# Patient Record
Sex: Female | Born: 1935 | Race: White | Hispanic: No
Health system: Southern US, Community
[De-identification: ages and names within clinical notes are randomized; demographics above are authoritative.]

---

## 2002-09-05 ENCOUNTER — Encounter: Payer: Self-pay | Admitting: Internal Medicine

## 2004-07-02 ENCOUNTER — Ambulatory Visit: Payer: Self-pay | Admitting: Internal Medicine

## 2004-07-14 ENCOUNTER — Ambulatory Visit: Payer: Self-pay | Admitting: Internal Medicine

## 2004-08-05 ENCOUNTER — Ambulatory Visit: Payer: Self-pay | Admitting: Internal Medicine

## 2004-08-21 ENCOUNTER — Ambulatory Visit: Payer: Self-pay | Admitting: Internal Medicine

## 2004-10-30 ENCOUNTER — Ambulatory Visit: Payer: Self-pay | Admitting: Internal Medicine

## 2005-03-04 ENCOUNTER — Ambulatory Visit: Payer: Self-pay | Admitting: Internal Medicine

## 2005-03-13 ENCOUNTER — Encounter: Payer: Self-pay | Admitting: Internal Medicine

## 2005-07-06 ENCOUNTER — Ambulatory Visit: Payer: Self-pay | Admitting: Internal Medicine

## 2005-11-04 ENCOUNTER — Ambulatory Visit: Payer: Self-pay | Admitting: Internal Medicine

## 2006-03-04 ENCOUNTER — Ambulatory Visit: Payer: Self-pay | Admitting: Internal Medicine

## 2006-03-26 ENCOUNTER — Ambulatory Visit: Payer: Self-pay | Admitting: *Deleted

## 2006-07-22 ENCOUNTER — Ambulatory Visit: Payer: Self-pay | Admitting: Internal Medicine

## 2006-07-22 LAB — CONVERTED CEMR LAB
Albumin: 3.5 g/dL (ref 3.5–5.2)
BUN: 40 mg/dL — ABNORMAL HIGH (ref 6–23)
CO2: 28 meq/L (ref 19–32)
Magnesium: 2.2 mg/dL (ref 1.5–2.5)
Phosphorus: 3.1 mg/dL (ref 2.3–4.6)
Potassium: 4.9 meq/L (ref 3.5–5.1)

## 2006-11-04 DIAGNOSIS — N259 Disorder resulting from impaired renal tubular function, unspecified: Secondary | ICD-10-CM | POA: Insufficient documentation

## 2006-11-04 DIAGNOSIS — M810 Age-related osteoporosis without current pathological fracture: Secondary | ICD-10-CM | POA: Insufficient documentation

## 2006-11-04 DIAGNOSIS — R002 Palpitations: Secondary | ICD-10-CM | POA: Insufficient documentation

## 2006-11-04 DIAGNOSIS — I10 Essential (primary) hypertension: Secondary | ICD-10-CM | POA: Insufficient documentation

## 2006-11-04 DIAGNOSIS — M109 Gout, unspecified: Secondary | ICD-10-CM

## 2006-11-04 DIAGNOSIS — M1990 Unspecified osteoarthritis, unspecified site: Secondary | ICD-10-CM | POA: Insufficient documentation

## 2006-11-24 ENCOUNTER — Ambulatory Visit: Payer: Self-pay | Admitting: Internal Medicine

## 2006-11-24 DIAGNOSIS — L723 Sebaceous cyst: Secondary | ICD-10-CM | POA: Insufficient documentation

## 2007-01-11 ENCOUNTER — Telehealth (INDEPENDENT_AMBULATORY_CARE_PROVIDER_SITE_OTHER): Payer: Self-pay | Admitting: *Deleted

## 2007-03-23 ENCOUNTER — Ambulatory Visit: Payer: Self-pay | Admitting: Internal Medicine

## 2007-03-24 LAB — CONVERTED CEMR LAB
Albumin: 3.7 g/dL (ref 3.5–5.2)
BUN: 50 mg/dL — ABNORMAL HIGH (ref 6–23)
Basophils Absolute: 0 10*3/uL (ref 0.0–0.1)
Basophils Relative: 0.6 % (ref 0.0–1.0)
Creatinine, Ser: 1.8 mg/dL — ABNORMAL HIGH (ref 0.4–1.2)
Eosinophils Absolute: 0.2 10*3/uL (ref 0.0–0.6)
GFR calc Af Amer: 34 mL/min
GFR calc non Af Amer: 28 mL/min
Lymphocytes Relative: 22.8 % (ref 12.0–46.0)
MCHC: 34.4 g/dL (ref 30.0–36.0)
Monocytes Relative: 7.8 % (ref 3.0–11.0)
Neutro Abs: 5.6 10*3/uL (ref 1.4–7.7)
Phosphorus: 3.8 mg/dL (ref 2.3–4.6)
Platelets: 178 10*3/uL (ref 150–400)
Potassium: 5 meq/L (ref 3.5–5.1)
TSH: 2.45 microintl units/mL (ref 0.35–5.50)

## 2007-08-04 ENCOUNTER — Ambulatory Visit: Payer: Self-pay | Admitting: Internal Medicine

## 2007-08-05 LAB — CONVERTED CEMR LAB
CO2: 30 meq/L (ref 19–32)
GFR calc Af Amer: 34 mL/min
Glucose, Bld: 95 mg/dL (ref 70–99)
Phosphorus: 3.6 mg/dL (ref 2.3–4.6)
Potassium: 5.1 meq/L (ref 3.5–5.1)
Sodium: 140 meq/L (ref 135–145)

## 2007-08-08 ENCOUNTER — Emergency Department: Payer: Self-pay | Admitting: Emergency Medicine

## 2007-08-09 ENCOUNTER — Telehealth (INDEPENDENT_AMBULATORY_CARE_PROVIDER_SITE_OTHER): Payer: Self-pay | Admitting: *Deleted

## 2007-08-10 ENCOUNTER — Ambulatory Visit: Payer: Self-pay | Admitting: *Deleted

## 2007-12-08 ENCOUNTER — Ambulatory Visit: Payer: Self-pay | Admitting: Internal Medicine

## 2007-12-12 LAB — CONVERTED CEMR LAB
Albumin: 3.6 g/dL (ref 3.5–5.2)
Basophils Absolute: 0 10*3/uL (ref 0.0–0.1)
CO2: 26 meq/L (ref 19–32)
Chloride: 108 meq/L (ref 96–112)
Creatinine, Ser: 1.9 mg/dL — ABNORMAL HIGH (ref 0.4–1.2)
Eosinophils Absolute: 0.1 10*3/uL (ref 0.0–0.7)
GFR calc Af Amer: 32 mL/min
GFR calc non Af Amer: 26 mL/min
Lymphocytes Relative: 25.1 % (ref 12.0–46.0)
MCHC: 34.6 g/dL (ref 30.0–36.0)
MCV: 86.8 fL (ref 78.0–100.0)
Neutrophils Relative %: 67.9 % (ref 43.0–77.0)
Phosphorus: 3.8 mg/dL (ref 2.3–4.6)
RDW: 13.9 % (ref 11.5–14.6)
Sodium: 142 meq/L (ref 135–145)

## 2007-12-19 ENCOUNTER — Telehealth (INDEPENDENT_AMBULATORY_CARE_PROVIDER_SITE_OTHER): Payer: Self-pay | Admitting: *Deleted

## 2008-03-01 ENCOUNTER — Ambulatory Visit: Payer: Self-pay | Admitting: Internal Medicine

## 2008-04-10 ENCOUNTER — Ambulatory Visit: Payer: Self-pay | Admitting: Internal Medicine

## 2008-08-15 ENCOUNTER — Ambulatory Visit: Payer: Self-pay | Admitting: Internal Medicine

## 2008-08-16 LAB — CONVERTED CEMR LAB
ALT: 12 units/L (ref 0–35)
AST: 15 units/L (ref 0–37)
Alkaline Phosphatase: 67 units/L (ref 39–117)
BUN: 39 mg/dL — ABNORMAL HIGH (ref 6–23)
Basophils Absolute: 0 10*3/uL (ref 0.0–0.1)
Bilirubin, Direct: 0 mg/dL (ref 0.0–0.3)
Calcium: 10.4 mg/dL (ref 8.4–10.5)
Eosinophils Relative: 1.9 % (ref 0.0–5.0)
GFR calc non Af Amer: 27.9 mL/min (ref >60)
Glucose, Bld: 95 mg/dL (ref 70–99)
HCT: 34.6 % — ABNORMAL LOW (ref 36.0–46.0)
Lymphocytes Relative: 28.4 % (ref 12.0–46.0)
Monocytes Relative: 8.8 % (ref 3.0–12.0)
Neutrophils Relative %: 60.7 % (ref 43.0–77.0)
Platelets: 170 10*3/uL (ref 150.0–400.0)
Potassium: 4.5 meq/L (ref 3.5–5.1)
RDW: 13.3 % (ref 11.5–14.6)
Sodium: 144 meq/L (ref 135–145)
TSH: 2.61 microintl units/mL (ref 0.35–5.50)
Total Bilirubin: 0.9 mg/dL (ref 0.3–1.2)
WBC: 8.3 10*3/uL (ref 4.5–10.5)

## 2008-12-01 ENCOUNTER — Telehealth: Payer: Self-pay | Admitting: Internal Medicine

## 2008-12-19 ENCOUNTER — Ambulatory Visit: Payer: Self-pay | Admitting: Internal Medicine

## 2008-12-20 LAB — CONVERTED CEMR LAB
Albumin: 3.6 g/dL (ref 3.5–5.2)
BUN: 45 mg/dL — ABNORMAL HIGH (ref 6–23)
CO2: 28 meq/L (ref 19–32)
Calcium: 10.2 mg/dL (ref 8.4–10.5)
Chloride: 106 meq/L (ref 96–112)
Creatinine, Ser: 2 mg/dL — ABNORMAL HIGH (ref 0.4–1.2)

## 2009-02-01 ENCOUNTER — Telehealth: Payer: Self-pay | Admitting: Internal Medicine

## 2009-02-04 ENCOUNTER — Ambulatory Visit: Payer: Self-pay | Admitting: Internal Medicine

## 2009-02-04 DIAGNOSIS — R5383 Other fatigue: Secondary | ICD-10-CM

## 2009-02-04 DIAGNOSIS — R5381 Other malaise: Secondary | ICD-10-CM

## 2009-02-06 LAB — CONVERTED CEMR LAB
Albumin: 3.5 g/dL (ref 3.5–5.2)
Basophils Absolute: 0.1 10*3/uL (ref 0.0–0.1)
Basophils Relative: 1 % (ref 0.0–3.0)
Bilirubin, Direct: 0 mg/dL (ref 0.0–0.3)
CO2: 27 meq/L (ref 19–32)
Calcium: 10.4 mg/dL (ref 8.4–10.5)
Creatinine, Ser: 2 mg/dL — ABNORMAL HIGH (ref 0.4–1.2)
Eosinophils Absolute: 0.4 10*3/uL (ref 0.0–0.7)
MCHC: 34.3 g/dL (ref 30.0–36.0)
MCV: 86.1 fL (ref 78.0–100.0)
Monocytes Absolute: 0.6 10*3/uL (ref 0.1–1.0)
Neutro Abs: 4.2 10*3/uL (ref 1.4–7.7)
Neutrophils Relative %: 59 % (ref 43.0–77.0)
RBC: 3.78 M/uL — ABNORMAL LOW (ref 3.87–5.11)
RDW: 14 % (ref 11.5–14.6)
Sodium: 141 meq/L (ref 135–145)
TSH: 3.31 microintl units/mL (ref 0.35–5.50)
Total Protein: 6.2 g/dL (ref 6.0–8.3)

## 2009-03-11 ENCOUNTER — Ambulatory Visit: Payer: Self-pay | Admitting: Internal Medicine

## 2009-05-13 ENCOUNTER — Telehealth: Payer: Self-pay | Admitting: Internal Medicine

## 2009-05-14 ENCOUNTER — Telehealth: Payer: Self-pay | Admitting: Internal Medicine

## 2009-06-19 ENCOUNTER — Ambulatory Visit: Payer: Self-pay | Admitting: Internal Medicine

## 2009-06-20 LAB — CONVERTED CEMR LAB
ALT: 11 units/L (ref 0–35)
AST: 15 units/L (ref 0–37)
Alkaline Phosphatase: 69 units/L (ref 39–117)
Basophils Absolute: 0 10*3/uL (ref 0.0–0.1)
Basophils Relative: 0.3 % (ref 0.0–3.0)
CO2: 27 meq/L (ref 19–32)
Chloride: 107 meq/L (ref 96–112)
Eosinophils Relative: 1.6 % (ref 0.0–5.0)
GFR calc non Af Amer: 26.17 mL/min (ref >60)
Hemoglobin: 11.9 g/dL — ABNORMAL LOW (ref 12.0–15.0)
Lymphocytes Relative: 29.2 % (ref 12.0–46.0)
Monocytes Relative: 8.9 % (ref 3.0–12.0)
Neutro Abs: 4.9 10*3/uL (ref 1.4–7.7)
Phosphorus: 3.3 mg/dL (ref 2.3–4.6)
Potassium: 4.5 meq/L (ref 3.5–5.1)
RBC: 3.95 M/uL (ref 3.87–5.11)
TSH: 2.79 microintl units/mL (ref 0.35–5.50)
Total Protein: 6.9 g/dL (ref 6.0–8.3)
WBC: 8.1 10*3/uL (ref 4.5–10.5)

## 2009-07-10 ENCOUNTER — Ambulatory Visit: Payer: Self-pay | Admitting: Family Medicine

## 2009-09-09 ENCOUNTER — Telehealth: Payer: Self-pay | Admitting: Internal Medicine

## 2009-09-10 ENCOUNTER — Ambulatory Visit: Payer: Self-pay | Admitting: Internal Medicine

## 2009-09-10 DIAGNOSIS — F329 Major depressive disorder, single episode, unspecified: Secondary | ICD-10-CM

## 2009-09-12 ENCOUNTER — Telehealth: Payer: Self-pay | Admitting: Internal Medicine

## 2009-10-21 ENCOUNTER — Ambulatory Visit: Payer: Self-pay | Admitting: Internal Medicine

## 2010-01-27 ENCOUNTER — Telehealth (INDEPENDENT_AMBULATORY_CARE_PROVIDER_SITE_OTHER): Payer: Self-pay | Admitting: *Deleted

## 2010-01-30 ENCOUNTER — Ambulatory Visit: Payer: Self-pay | Admitting: Internal Medicine

## 2010-01-30 DIAGNOSIS — F068 Other specified mental disorders due to known physiological condition: Secondary | ICD-10-CM

## 2010-04-28 ENCOUNTER — Emergency Department: Payer: Self-pay | Admitting: Unknown Physician Specialty

## 2010-05-02 ENCOUNTER — Telehealth: Payer: Self-pay | Admitting: Internal Medicine

## 2010-05-02 ENCOUNTER — Inpatient Hospital Stay: Payer: Self-pay | Admitting: Internal Medicine

## 2010-05-05 ENCOUNTER — Encounter: Payer: Self-pay | Admitting: Internal Medicine

## 2010-05-26 ENCOUNTER — Ambulatory Visit: Admit: 2010-05-26 | Payer: Self-pay | Admitting: Internal Medicine

## 2010-06-17 NOTE — Assessment & Plan Note (Signed)
Summary: FOLLOW UP / LFW   Vital Signs:  Patient profile:   77 year old female Weight:      130 pounds BMI:     22.40 Temp:     98.6 degrees F oral BP sitting:   158 / 80  (left arm) Cuff size:   regular  Vitals Entered By: Mervin Hack CMA Duncan Dull) (October 21, 2009 2:41 PM) CC: 4 month follow-up   History of Present Illness: Cousin called patient refused the citalopram   Again she just states that "I am a crabby old lady" still grieving for daughter after 2 years fears her response would be similar to her daughter--who didn't do well with meds offered counselling--she didn't want this now  ongoing aches and pains knee and back  pain ---degenerative disease aspirin helps some  More forgetful this is upsetting to her repeating herself at times  Allergies: 1)  ! Aspirin 2)  ! Hydrochlorothiazide  Past History:  Past medical, surgical, family and social histories (including risk factors) reviewed for relevance to current acute and chronic problems.  Past Medical History: Reviewed history from 09/10/2009 and no changes required. Gout Hypertension Osteoarthritis--knees/back Osteoporosis Renal insufficiency Palpitations Depression  Past Surgical History: Reviewed history from 03/23/2007 and no changes required. L Hip fracture ORIF Hyacinth Meeker ) 2000/10/18 Hysterectomy/ L ovary out 1956 Perforated colon (colostomy x 5 months )            colostomy reversal  1984 Echo mild LVH/MR  Family History: Reviewed history from 11/04/2006 and no changes required. Mom and Dad both died of CAD 3 brothers--2 with CAD 2 sisters--1 died as nurse in WWII ??uterine cancer in [place]    Social History: Reviewed history from 11/04/2006 and no changes required. Widowed 10-19-82 1 daughter with Down's--died @62 --very difficult for her Occupation: home caregiver Never Smoked Alcohol use-no  Review of Systems       bowels have been regular eating fair weight up 2# sleeping  okay   Impression & Recommendations:  Problem # 1:  DEPRESSION (ICD-311) Assessment Unchanged discussed with her early memory problems--may be related to mood problems She has voiced a willingness to try the medicine now some degree of lonlieness discussed considering assisted living  counselled all of 15 minute visit  Her updated medication list for this problem includes:    Citalopram Hydrobromide 10 Mg Tabs (Citalopram hydrobromide) .Marland Kitchen... 1 tab daily for depression  Complete Medication List: 1)  Citalopram Hydrobromide 10 Mg Tabs (Citalopram hydrobromide) .Marland Kitchen.. 1 tab daily for depression 2)  Toprol Xl 50 Mg Tb24 (Metoprolol succinate) .... 1/2 tab daily 3)  Lasix 20 Mg Tabs (Furosemide) .... Take 1 tablet by mouth once a day 4)  Diovan 80 Mg Tabs (Valsartan) .... Take 1 tablet by mouth once a day 5)  Timoptic 0.5 % Soln (Timolol maleate) .... Use one drop in left eye daily 6)  Aleve 220 Mg Tabs (Naproxen sodium) .... As needed 7)  Lutein 6 Mg Caps (Lutein) .... Take one by mouth two times a day 8)  Centrum Silver Tabs (Multiple vitamins-minerals) .... Take one by mouth daily 9)  Vitamin D 1000 Unit Caps (Cholecalciferol) .... Take one tablet by mouth daily 10)  Flax Seeds Powd (Flaxseed (linseed)) .... Take 1 tbs daily 11)  Lidoderm 5 % Ptch (Lidocaine) .... Apply to left knee for 12 hours a day for arthritis pain  Patient Instructions: 1)  Please schedule a follow-up appointment in 1 month.   Current  Allergies (reviewed today): ! ASPIRIN ! HYDROCHLOROTHIAZIDE

## 2010-06-17 NOTE — Progress Notes (Signed)
Summary: wont take celexa  Phone Note Call from Patient   Caller: cousin Eather Colas [phone] Summary of Call: Pt's cousin states pt is now refusing to take celexa, didnt take it this morning.  She says she is not depressed and doesnt need it.  4 week follow up appt cancelled, she will be back in june for 4 month follow up. Initial call taken by: Lowella Petties CMA,  September 12, 2009 12:28 PM  Follow-up for Phone Call        have cousin call me if she thinks things are worsening and we will push up the visit Follow-up by: Cindee Salt MD,  September 12, 2009 1:05 PM  Additional Follow-up for Phone Call Additional follow up Details #1::        Advised pt. Additional Follow-up by: Lowella Petties CMA,  September 12, 2009 2:02 PM

## 2010-06-17 NOTE — Progress Notes (Signed)
Summary: regarding dementia  Phone Note Call from Patient Call back at [phone]   Caller: Nigel Bridgeman Summary of Call: Pt's cousin, who says she has POA, is asking if you can give pt the diagnosis of dementia so that she can go into memory care at [facility].  Cousin says pt is getting forgetfull, not taking  her meds.  I told her that pt will need to be seen for evaluation and documentation.  Can you work her in sometime this week? Initial call taken by: Lowella Petties CMA,  January 27, 2010 9:12 AM  Follow-up for Phone Call        that diagnosis is probably correct Okay to give her appt I will contact Memory Care staff as well Follow-up by: Cindee Salt MD,  January 27, 2010 9:58 AM  Additional Follow-up for Phone Call Additional follow up Details #1::        I spoke w/ pt's cousin,Delores.  She will bring pt in for appt. on 01/30/10 @ 1:45. Additional Follow-up by: Beau Fanny,  January 27, 2010 10:16 AM

## 2010-06-17 NOTE — Assessment & Plan Note (Signed)
Summary: RIGHT KNEE PAIN/CLE   Vital Signs:  Patient profile:   77 year old female Height:      64 inches Weight:      125.50 pounds Temp:     98.3 degrees F oral Pulse rate:   64 / minute Pulse rhythm:   regular BP sitting:   130 / 82  (left arm) Cuff size:   regular  Vitals Entered By: Linde Gillis CMA Duncan Dull) (July 10, 2009 3:10 PM) CC: right knee pain   History of Present Illness: 77 year old female:  Left knee has been bad for years. Had a Synvisc injection series - Dr. Hyacinth Meeker which did minimal, no help.  Now the right knee is hurting a lot. Over the last week.  R knee: No significant swelling. No injury. No trauma. No old injuries that she can recall. Walks with a walker.   Allergies: 1)  ! Aspirin 2)  ! Hydrochlorothiazide  Past History:  Past medical, surgical, family and social histories (including risk factors) reviewed, and no changes noted (except as noted below).  Past Medical History: Reviewed history from 11/04/2006 and no changes required. Gout Hypertension Osteoarthritis--knees/back Osteoporosis Renal insufficiency Palpitations  Past Surgical History: Reviewed history from 03/23/2007 and no changes required. L Hip fracture ORIF Hyacinth Meeker ) October 17, 2000 Hysterectomy/ L ovary out 1956 Perforated colon (colostomy x 5 months )            colostomy reversal  1984 Echo mild LVH/MR  Family History: Reviewed history from 11/04/2006 and no changes required. Mom and Dad both died of CAD 3 brothers--2 with CAD 2 sisters--1 died as nurse in WWII ??uterine cancer in [place]    Social History: Reviewed history from 11/04/2006 and no changes required. Widowed October 18, 1982 1 daughter with Down's--died @62 --very difficult for her Occupation: home caregiver Never Smoked Alcohol use-no  Review of Systems       REVIEW OF SYSTEMS  GEN: No systemic complaints, no fevers, chills, sweats, or other acute illnesses MSK: Detailed in the HPI GI: tolerating PO intake  without difficulty Neuro: No numbness, parasthesias, or tingling associated. Otherwise the pertinent positives of the ROS are noted above.    Physical Exam  General:  Elderlywell-nourished and well-hydrated.   Head:  Normocephalic and atraumatic without obvious abnormalities. No apparent alopecia or balding. Ears:  no external deformities.   Nose:  no external deformity.   Lungs:  normal respiratory effort.   Msk:  L knee, lack 2-3 degrees ext, flexion to 100. TTP medial joint line. Diffuse crepitus. Stable to varus and valgus stress. Neg lachman  R knee, full ext, flexion to 115, terminal pain on ext. moderate crepitus with mild joint line pain. Stable MCL, LCL. neg lachman.   Impression & Recommendations:  Problem # 1:  OSTEOARTHRITIS (ICD-715.90) Assessment Deteriorated >25 minutes spent in face to face time with patient, >50% spent in counselling or coordination of care: spent most time talking to patient and her cousin about OA, function, hyaluronic acid, cartilage transplantation, glucosamine, all potential modalities. I think anything to increase her comfort is reasonable.  Please see the patient instructions for a detailed list of plans and what was discussed with the patient.   X-rays: AP Bilateral Weight-bearing, Weightbearing Lateral, Sunrise views Indication: knee pain Findings:  L knee with end stage arthritis, bone on bone. R knee with moderate tricompartmental OA changes.  Knee Injection, R  Patient verbally consented to procedure. Risks, benefits, and alternatives explained. Sterilely prepped with betadine. Ethyl cholride  used for anesthesia. 9 cc 0.5% Marcaine mixed with 1 cc of Kenalog 40 mg injected using the anterolateral approach without difficulty. No complications with procedure and tolerated well. Patient had decreased pain post-injection.   Her updated medication list for this problem includes:    Aleve 220 Mg Tabs (Naproxen sodium) .Marland Kitchen... As  needed  Orders: Radiology other (Radiology Other) Joint Aspirate / Injection, Large (20610) Kenalog 10mg  (4units) (J3301)  Complete Medication List: 1)  Toprol Xl 50 Mg Tb24 (Metoprolol succinate) .... 1/2 tab daily 2)  Lasix 20 Mg Tabs (Furosemide) .... Take 1 tablet by mouth once a day 3)  Diovan 80 Mg Tabs (Valsartan) .... Take 1 tablet by mouth once a day 4)  Timoptic 0.5 % Soln (Timolol maleate) .... Use one drop in left eye daily 5)  Aleve 220 Mg Tabs (Naproxen sodium) .... As needed 6)  Lutein 6 Mg Caps (Lutein) .... Take one by mouth two times a day 7)  Centrum Silver Tabs (Multiple vitamins-minerals) .... Take one by mouth daily 8)  Vitamin D 1000 Unit Caps (Cholecalciferol) .... Take one tablet by mouth daily 9)  Flax Seeds Powd (Flaxseed (linseed)) .... Take 1 tbs daily 10)  Lidoderm 5 % Ptch (Lidocaine) .... Apply to left knee for 12 hours a day for arthritis pain  Patient Instructions: 1)  Tylenol: 2 tablets up to 3-4 times a day 2)  Topical Capzaicin Cream, as needed (wear glove to put on) 3)  For flares, corticosteroid injections help. 4)  Hyaluronic Acid injections have good success, average relief is 6 months 5)  Omega-3 fish oils may help 6)  Ice joints on bad days, 20 min, 2-3 x / day  Current Allergies (reviewed today): ! ASPIRIN ! HYDROCHLOROTHIAZIDE

## 2010-06-17 NOTE — Assessment & Plan Note (Signed)
Summary: behavior problems/alc   Vital Signs:  Patient profile:   77 year old female Weight:      128 pounds Temp:     98.7 degrees F oral Pulse rate:   68 / minute Pulse rhythm:   regular BP sitting:   128 / 62  (left arm) Cuff size:   regular  Vitals Entered By: Mervin Hack CMA Duncan Dull) (September 10, 2009 12:38 PM) CC: behavior problems   History of Present Illness: Cousin is concerned see phone note  "I'm just a plain old crabby lady" sleeping a lot Crying, having depressed mood Goes back for 2-3 months No precipitating factors Tough time of year---misses husband and daughter  No social involvement Can't drive now with vision Can't walk much Has been isolated since caring for daughter with Down's syndrome who died 2 years ago  does have anhedonia Thinks about dying but has never considered suicide    Allergies: 1)  ! Aspirin 2)  ! Hydrochlorothiazide  Past History:  Past medical, surgical, family and social histories (including risk factors) reviewed for relevance to current acute and chronic problems.  Past Medical History: Gout Hypertension Osteoarthritis--knees/back Osteoporosis Renal insufficiency Palpitations Depression  Past Surgical History: Reviewed history from 03/23/2007 and no changes required. L Hip fracture ORIF Hyacinth Meeker ) 2000/10/08 Hysterectomy/ L ovary out 1956 Perforated colon (colostomy x 5 months )            colostomy reversal  1984 Echo mild LVH/MR  Family History: Reviewed history from 11/04/2006 and no changes required. Mom and Dad both died of CAD 3 brothers--2 with CAD 2 sisters--1 died as nurse in [place] ??uterine cancer in [place]    Social History: Reviewed history from 11/04/2006 and no changes required. 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  NAD Neck:  supple, no masses, no thyromegaly, and no cervical lymphadenopathy.   Lungs:  normal respiratory effort and normal breath sounds.   Heart:  normal rate, regular rhythm, no murmur, and no gallop.   Abdomen:  soft and non-tender.   Extremities:  no edema Neurologic:  alert & oriented X3.   No focal weakness Psych:  normally interactive, good eye contact, not anxious appearing, and dysphoric affect.     Impression & Recommendations:  Problem # 1:  DEPRESSION (ICD-311) Assessment New  clearly fits criteria for major depression reluctant to take meds but willing to try will start low dose citalopram   call if any side effects discussed with her and cousin  Her updated medication list for this problem includes:    Citalopram Hydrobromide 10 Mg Tabs (Citalopram hydrobromide) .Marland Kitchen... 1 tab daily for depression  Orders: Prescription Created Electronically [phone])  Complete Medication List: 1)  Toprol Xl 50 Mg Tb24 (Metoprolol succinate) .... 1/2 tab daily 2)  Lasix 20 Mg Tabs (Furosemide) .... Take 1 tablet by mouth once a day 3)  Diovan 80 Mg Tabs (Valsartan) .... Take 1 tablet by mouth once a day 4)  Timoptic 0.5 % Soln (Timolol maleate) .... Use one drop in left eye daily 5)  Aleve 220 Mg Tabs (Naproxen sodium) .... As needed 6)  Lutein 6 Mg Caps (Lutein) .... Take one by mouth two times a day 7)  Centrum Silver Tabs (Multiple vitamins-minerals) .... Take one by mouth daily 8)  Vitamin D 1000 Unit Caps (Cholecalciferol) .... Take one tablet by mouth daily 9)  Flax Seeds Powd (Flaxseed (linseed)) .... Take 1 tbs daily 10)  Lidoderm 5 % Ptch (Lidocaine) .... Apply to left knee for 12 hours a day for arthritis pain 11)  Citalopram Hydrobromide 10 Mg Tabs (Citalopram hydrobromide) .Marland Kitchen.. 1 tab daily for depression  Patient Instructions: 1)  Please schedule a follow-up  appointment in 3-4  weeks.  Prescriptions: CITALOPRAM HYDROBROMIDE 10 MG TABS (CITALOPRAM HYDROBROMIDE) 1 tab daily for depression  #30 x 11   Entered and Authorized by:   Cindee Salt MD   Signed by:   Cindee Salt MD on 09/10/2009   Method used:   Electronically to        [facility]* (retail)       [address]       [place], [place]  [address]       Ph: [phone]       Fax: [phone]   RxID:   [id number]   Current Allergies (reviewed today): ! ASPIRIN ! HYDROCHLOROTHIAZIDE

## 2010-06-17 NOTE — Progress Notes (Signed)
Summary: Behavior Issues  Phone Note Call from Patient Call back at [phone]   Caller: Delores [place]/Cousin-Power of Attorney Call For: Cindee Salt MD Summary of Call: Has been sleeping and crying for the last several weeks.  Family wants her to move into Assisted Living but she refuses to.  Wants Dr. Alphonsus Sias to prescribe some type of tranqulizer to help her relax or calm down.  Seems to be depressed and doesn't want to be bothered.  States that "she doesn't know why she is still her" every other sentence her cousin says.  She just sits around all day like she is zoned out so to speak.  Uses [facility]. Initial call taken by: Linde Gillis CMA Duncan Dull),  September 09, 2009 11:21 AM  Follow-up for Phone Call        this is a major change for her Please set up appt ASAP for eval and to make decisions about Rx tomorrow or Wednesday at the latest Follow-up by: Cindee Salt MD,  September 09, 2009 1:07 PM  Additional Follow-up for Phone Call Additional follow up Details #1::        POA is checking with patient and she will call back and schedule appt. DeShannon Smith CMA Duncan Dull)  September 09, 2009 2:49 PM   appt scheduled. Additional Follow-up by: Mervin Hack CMA Duncan Dull),  September 09, 2009 3:00 PM

## 2010-06-17 NOTE — Assessment & Plan Note (Signed)
Summary: 6 mo. f/u/bir   Vital Signs:  Patient profile:   77 year old female Weight:      125 pounds Temp:     97.2 degrees F oral Pulse rate:   68 / minute Pulse rhythm:   regular BP sitting:   110 / 60  (left arm) Cuff size:   regular  Vitals Entered By: Mervin Hack CMA Duncan Dull) (February  2, 10/18/2009 11:40 AM) CC: 6 month follow-up   History of Present Illness: Doing fairly well No further spells Now not going grocery shopping  Main concern is ongoing left knee pain Takes aleve but only if she goes out afraid of hydrocodone Tried rollator walker but felt "that the wheels got away from me"  Looking into assisted living at [facility] having a hard time cooking  No chest pain No SOB  Mild edema--lasix controls  Allergies: 1)  ! Aspirin 2)  ! Hydrochlorothiazide  Past History:  Past medical, surgical, family and social histories (including risk factors) reviewed for relevance to current acute and chronic problems.  Past Medical History: Reviewed history from 11/04/2006 and no changes required. Gout Hypertension Osteoarthritis--knees/back Osteoporosis Renal insufficiency Palpitations  Past Surgical History: Reviewed history from 03/23/2007 and no changes required. L Hip fracture ORIF Hyacinth Meeker ) 2000-10-18 Hysterectomy/ L ovary out 1956 Perforated colon (colostomy x 5 months )            colostomy reversal  1984 Echo mild LVH/MR  Family History: Reviewed history from 11/04/2006 and no changes required. Mom and Dad both died of CAD 3 brothers--2 with CAD 2 sisters--1 died as nurse in [place] ??uterine cancer in [place]    Social History: Reviewed history from 11/04/2006 and no changes required. 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   Neck:  supple, no masses, no thyromegaly, no carotid bruits, and no cervical lymphadenopathy.   Lungs:  normal respiratory effort and normal breath sounds.   Heart:  normal rate, regular rhythm, no murmur, and no gallop.   Extremities:  no sig edema Psych:  normally interactive, good eye contact, not anxious appearing, and not depressed appearing.     Impression & Recommendations:  Problem # 1:  WEAKNESS (ICD-780.79) Assessment Improved  better but still functional problems perfect candidate for asssisted living---is looking into this  Orders: TLB-Renal Function Panel (80069-RENAL) TLB-CBC Platelet - w/Differential (85025-CBCD) TLB-Hepatic/Liver Function Pnl (80076-HEPATIC) TLB-TSH (Thyroid Stimulating Hormone) (84443-TSH) Venipuncture (69629)  Problem # 2:  OSTEOARTHRITIS (ICD-715.90) Assessment: Deteriorated more left knee pain will try lidoderm  Her updated medication list for this problem includes:    Aleve 220 Mg Tabs (Naproxen sodium) .Marland Kitchen... As needed  Problem # 3:  HYPERTENSION (ICD-401.9) Assessment: Unchanged BP fine no changes now  Her updated medication list for this problem includes:    Toprol Xl 50 Mg Tb24 (Metoprolol succinate) .Marland Kitchen... 1/2 tab daily    Lasix 20 Mg Tabs (Furosemide) .Marland Kitchen... Take 1 tablet by mouth once a day    Diovan 80 Mg Tabs (Valsartan) .Marland Kitchen... Take 1 tablet by mouth once a day  BP today: 110/60 Prior BP: 158/80 (03/11/2009)  Labs Reviewed: K+: 4.8 (02/04/2009) Creat: : 2.0 (02/04/2009)  Problem # 4:  RENAL INSUFFICIENCY (ICD-588.9) Assessment: Comment Only no symptoms  Complete Medication List: 1)  Toprol Xl 50 Mg Tb24 (Metoprolol succinate) .... 1/2 tab daily 2)  Lasix 20 Mg Tabs (Furosemide) .... Take 1 tablet by mouth once a day 3)  Diovan 80 Mg Tabs (Valsartan) .... Take 1 tablet by mouth once a day 4)  Timoptic 0.5 % Soln (Timolol  maleate) .... Use one drop in left eye daily 5)  Aleve 220 Mg Tabs (Naproxen sodium) .... As needed 6)  Lutein 6 Mg Caps (Lutein) .... Take one by mouth two times a day 7)  Centrum Silver Tabs (Multiple vitamins-minerals) .... Take one by mouth daily 8)  Vitamin D (ergocalciferol) 50000 Unit Caps (Ergocalciferol) .Marland Kitchen.. 1 capsule by mouth monthly 9)  Flax Seeds Powd (Flaxseed (linseed)) .... Take 1 tbs daily 10)  Lidoderm 5 % Ptch (Lidocaine) .... Apply to left knee for 12 hours a day for arthritis pain  Patient Instructions: 1)  Please schedule a follow-up appointment in 4 months .  Prescriptions: LIDODERM 5 % PTCH (LIDOCAINE) apply to left knee for 12 hours a day for arthritis pain  #30 x 11   Entered and Authorized by:   Cindee Salt MD   Signed by:   Cindee Salt MD on 06/19/2009   Method used:   Electronically to        [facility]* (retail)       [address]       [place], [place]  [address]       Ph: [phone]       Fax: [phone]   RxID:   [id number]   Current Allergies (reviewed today): ! ASPIRIN ! HYDROCHLOROTHIAZIDE

## 2010-06-17 NOTE — Assessment & Plan Note (Signed)
Summary: 1:45 PER DR LETVAK/CLE   Vital Signs:  Patient profile:   77 year old female Weight:      132 pounds Temp:     98.3 degrees F oral Pulse rate:   98 / minute Pulse rhythm:   regular BP sitting:   140 / 60  (left arm) Cuff size:   regular  Vitals Entered By: Mervin Hack CMA Duncan Dull) (January 30, 2010 1:55 PM) CC: follow-up visit   History of Present Illness: In with cousin Delores as usual  She feels "crabby, as usual" Has some pain, esp with cold front coming through takes aleve as needed if she is going out--but generally just takes it easy if staying at home  [facility] notes some memory is a problem Cousin notes that she repeats herself frequently Believes that she takes her meds right, but she forgets what she has done hasn't used the weekly pill container  No chest pain No SOB  Doesnt' drive Delores brings food, that can be microwaved rarely gets from [facility]  Talks to cousin about being lonesome all the time has resisted going to assisted living  Not truly depressed but does have some degree of anhedonia  Allergies: 1)  ! Aspirin 2)  ! Hydrochlorothiazide  Past History:  Past medical, surgical, family and social histories (including risk factors) reviewed for relevance to current acute and chronic problems.  Past Medical History: Reviewed history from 09/10/2009 and no changes required. Gout Hypertension Osteoarthritis--knees/back Osteoporosis Renal insufficiency Palpitations Depression  Past Surgical History: Reviewed history from 03/23/2007 and no changes required. L Hip fracture ORIF Hyacinth Meeker ) 10/22/2000 Hysterectomy/ L ovary out 1956 Perforated colon (colostomy x 5 months )            colostomy reversal  1984 Echo mild LVH/MR  Family History: Reviewed history from 11/04/2006 and no changes required. Mom and Dad both died of CAD 3 brothers--2 with CAD 2 sisters--1 died as nurse in WWII ??uterine cancer in [place] aunt    Social  History: Reviewed history from 11/04/2006 and no changes required. Widowed 10/23/82 1 daughter with Down's--died @62 --very difficult for her Occupation: home caregiver Never Smoked Alcohol use-no  Review of Systems       appetite is good weight up 2#  Physical Exam  Neurologic:  Thursday, September 15th, 2011 Doctor's office [address]-- "Obama, Danae Orleans, another Bush" didn't rememjber Clinton  [id number](then," no it is more than that... 74") d-r-o-w  recall--had trouble with "pen" right away 1/3 after 3 minutes Psych:  normally interactive, good eye contact, not anxious appearing, and not depressed appearing.     Impression & Recommendations:  Problem # 1:  DEMENTIA (ICD-294.8) Assessment New worsening and now seems to be more than just cognitive impairment slow functional decline she has isolated herself due to this  Probably doesn't need Memory Care but would be appropriate for assisted living discussed this with her--- agrees that she wants her name on the assisted living list  Problem # 2:  DEPRESSION (ICD-311) Assessment: Unchanged mild but not really worse she is isolated she states she wasn't aware of transportation to activities---will ask Angelica Chessman to review if she can get transportation to events she may be interested in   Her updated medication list for this problem includes:    Citalopram Hydrobromide 10 Mg Tabs (Citalopram hydrobromide) .Marland Kitchen... 1 tab daily for depression  Complete Medication List: 1)  Lidoderm 5 % Ptch (Lidocaine) .... Apply to left knee for 12 hours a day  for arthritis pain 2)  Citalopram Hydrobromide 10 Mg Tabs (Citalopram hydrobromide) .Marland Kitchen.. 1 tab daily for depression 3)  Toprol Xl 50 Mg Tb24 (Metoprolol succinate) .... 1/2 tab daily 4)  Lasix 20 Mg Tabs (Furosemide) .... Take 1 tablet by mouth once a day 5)  Diovan 80 Mg Tabs (Valsartan) .... Take 1 tablet by mouth once a day 6)  Timoptic 0.5 % Soln (Timolol maleate) .... Use  one drop in left eye daily 7)  Aleve 220 Mg Tabs (Naproxen sodium) .... As needed 8)  Lutein 6 Mg Caps (Lutein) .... Take one by mouth two times a day 9)  Centrum Silver Tabs (Multiple vitamins-minerals) .... Take one by mouth daily 10)  Vitamin D 1000 Unit Caps (Cholecalciferol) .... Take one tablet by mouth daily 11)  Flax Seeds Powd (Flaxseed (linseed)) .... Take 1 tbs daily  Patient Instructions: 1)  Please schedule a follow-up appointment in 3 months .   Current Allergies (reviewed today): ! ASPIRIN ! HYDROCHLOROTHIAZIDE  Appended Document: 1:45 PER DR LETVAK/CLE     Clinical Lists Changes  Orders: Added new Service order of Flu Vaccine 56yrs + [phone]) - Signed Added new Service order of Admin 1st Vaccine (60454) - Signed Added new Service order of Admin 1st Vaccine [facility]) [phone]) - Signed Observations: Added new observation of FLU VAX#1VIS: 12/10/09 version given January 30, 2010. (01/30/2010 14:35) Added new observation of FLU VAXLOT: [id number] (01/30/2010 14:35) Added new observation of FLU VAX EXP: 11/15/2010 (01/30/2010 14:35) Added new observation of FLU VAXBY: DeShannon Smith CMA (AAMA) (01/30/2010 14:35) Added new observation of FLU VAXRTE: IM (01/30/2010 14:35) Added new observation of FLU VAX DSE: 0.5 ml (01/30/2010 14:35) Added new observation of FLU VAXMFR: GlaxoSmithKline (01/30/2010 14:35) Added new observation of FLU VAX SITE: left deltoid (01/30/2010 14:35) Added new observation of FLU VAX: Fluvax 3+ (01/30/2010 14:35)       Influenza Vaccine    Vaccine Type: Fluvax 3+    Site: left deltoid    Mfr: GlaxoSmithKline    Dose: 0.5 ml    Route: IM    Given by: Mervin Hack CMA (AAMA)    Exp. Date: 11/15/2010    Lot #: [id number]    VIS given: 12/10/09 version given January 30, 2010.  Flu Vaccine Consent Questions    Do you have a history of severe allergic reactions to this vaccine? no    Any prior history of allergic reactions to egg  and/or gelatin? no    Do you have a sensitivity to the preservative Thimersol? no    Do you have a past history of Guillan-Barre Syndrome? no    Do you currently have an acute febrile illness? no    Have you ever had a severe reaction to latex? no    Vaccine information given and explained to patient? yes    Are you currently pregnant? no

## 2010-06-19 NOTE — Letter (Signed)
SummaryScientist, physiological [facility]  [facility]   Imported By: Lanelle Bal 05/13/2010 13:12:56  _____________________________________________________________________  External Attachment:    Type:   Image     Comment:   External Document

## 2010-06-19 NOTE — Progress Notes (Signed)
Summary: call a nurse   Phone Note Call from Patient   Caller: Patient Call For: Cindee Salt MD Summary of Call: Triage Record Num: [id number] Operator: Estevan Oaks Patient Name: Lindsay Hayes Call Date & Time: 05/01/2010 6:31:16PM Patient Phone: [phone] PCP: Patient Gender: Female PCP Fax : Patient DOB: Aug 09, 1913 Practice Name: [facility] Reason for Call: Sandford Craze, RN sts vitals ok but not "acting right" not responding "at all" now. Sts she was alert and oriented this am but tongiht she was only oriented to person. Upper strength is equal but very weak. The nursing staff wanted to send her to hosp but the family requested she contact MD office Rn adv'd to send to ED and notify family. Protocol(s) Used: Neurological Deficits Recommended Outcome per Protocol: Activate EMS 911 Reason for Outcome: Sudden change in mental status Care Advice:  ~ 12/ Initial call taken by: Melody Comas,  May 02, 2010 10:40 AM  Follow-up for Phone Call        please call to check on patient. Ruthe Mannan MD  May 02, 2010 10:42 AM  left message on machine at home for patient to return my call.  DeShannon Smith CMA Duncan Dull)  May 02, 2010 1:13 PM   left message on machine for cousin, Eather Colas ( comes with pt at visits) DeShannon Katrinka Blazing CMA Duncan Dull)  May 05, 2010 8:45 AM   Delores(cousin) daughter called back and stated that Eather Colas has a stroke and can't talk, per daughter remove Delores name from anything on pt's record, she is no longer POA. POA is now Rollen Sox [phone], per family pt is at [facility] and should be leaving today to return back to health care at [facility]. DeShannon Katrinka Blazing CMA Duncan Dull)  May 05, 2010 8:51 AM   Additional Follow-up for Phone Call Additional follow up Details #1::        Noted I will see her there Additional Follow-up by: Cindee Salt MD,  May 05, 2010 10:11 AM

## 2010-06-24 DIAGNOSIS — F329 Major depressive disorder, single episode, unspecified: Secondary | ICD-10-CM

## 2010-06-24 DIAGNOSIS — G309 Alzheimer's disease, unspecified: Secondary | ICD-10-CM

## 2010-06-24 DIAGNOSIS — F0280 Dementia in other diseases classified elsewhere without behavioral disturbance: Secondary | ICD-10-CM

## 2010-06-24 DIAGNOSIS — M130 Polyarthritis, unspecified: Secondary | ICD-10-CM

## 2010-06-24 DIAGNOSIS — I10 Essential (primary) hypertension: Secondary | ICD-10-CM

## 2010-08-06 DIAGNOSIS — F329 Major depressive disorder, single episode, unspecified: Secondary | ICD-10-CM

## 2010-08-06 DIAGNOSIS — R609 Edema, unspecified: Secondary | ICD-10-CM

## 2010-08-06 DIAGNOSIS — N390 Urinary tract infection, site not specified: Secondary | ICD-10-CM

## 2010-08-08 DIAGNOSIS — N3000 Acute cystitis without hematuria: Secondary | ICD-10-CM

## 2010-10-06 DIAGNOSIS — I10 Essential (primary) hypertension: Secondary | ICD-10-CM

## 2010-10-06 DIAGNOSIS — F329 Major depressive disorder, single episode, unspecified: Secondary | ICD-10-CM

## 2010-10-06 DIAGNOSIS — F0280 Dementia in other diseases classified elsewhere without behavioral disturbance: Secondary | ICD-10-CM

## 2010-10-06 DIAGNOSIS — G309 Alzheimer's disease, unspecified: Secondary | ICD-10-CM

## 2010-10-06 DIAGNOSIS — M1710 Unilateral primary osteoarthritis, unspecified knee: Secondary | ICD-10-CM

## 2010-10-09 DIAGNOSIS — M130 Polyarthritis, unspecified: Secondary | ICD-10-CM

## 2010-10-09 DIAGNOSIS — I10 Essential (primary) hypertension: Secondary | ICD-10-CM

## 2010-10-09 DIAGNOSIS — G309 Alzheimer's disease, unspecified: Secondary | ICD-10-CM

## 2010-10-09 DIAGNOSIS — F0280 Dementia in other diseases classified elsewhere without behavioral disturbance: Secondary | ICD-10-CM

## 2010-10-09 DIAGNOSIS — F329 Major depressive disorder, single episode, unspecified: Secondary | ICD-10-CM

## 2010-11-27 DIAGNOSIS — F329 Major depressive disorder, single episode, unspecified: Secondary | ICD-10-CM

## 2010-11-27 DIAGNOSIS — F0280 Dementia in other diseases classified elsewhere without behavioral disturbance: Secondary | ICD-10-CM

## 2010-11-27 DIAGNOSIS — M1289 Other specific arthropathies, not elsewhere classified, multiple sites: Secondary | ICD-10-CM

## 2010-11-27 DIAGNOSIS — G309 Alzheimer's disease, unspecified: Secondary | ICD-10-CM

## 2010-11-27 DIAGNOSIS — I10 Essential (primary) hypertension: Secondary | ICD-10-CM

## 2011-01-27 DIAGNOSIS — H109 Unspecified conjunctivitis: Secondary | ICD-10-CM

## 2011-02-18 ENCOUNTER — Telehealth: Payer: Self-pay | Admitting: *Deleted

## 2011-02-18 DIAGNOSIS — H3530 Unspecified macular degeneration: Secondary | ICD-10-CM | POA: Insufficient documentation

## 2011-02-18 NOTE — Telephone Encounter (Signed)
Pt was told by you to see her eye doctor for macular degeneration.  When someone from [facility] called to make the appt they were told they couldn't make one.  God daughter is asking if we can make the appt for her.  I asked her if she could make the appt, she said no, because she isnt on pt's hippa form at that office. I told her that shouldn't make any difference, anyone should be able to make her appt.  She said she would try, I advised her to call if she has any problems.

## 2011-02-19 NOTE — Telephone Encounter (Signed)
Opthy appt made with Dr Inez Pilgrim on 03/13/2011 at 3:10pm. [facility]

## 2011-03-20 DIAGNOSIS — F068 Other specified mental disorders due to known physiological condition: Secondary | ICD-10-CM

## 2011-03-20 DIAGNOSIS — I10 Essential (primary) hypertension: Secondary | ICD-10-CM

## 2011-03-20 DIAGNOSIS — F329 Major depressive disorder, single episode, unspecified: Secondary | ICD-10-CM

## 2011-03-20 DIAGNOSIS — N189 Chronic kidney disease, unspecified: Secondary | ICD-10-CM

## 2011-06-04 DIAGNOSIS — F0280 Dementia in other diseases classified elsewhere without behavioral disturbance: Secondary | ICD-10-CM

## 2011-06-04 DIAGNOSIS — I10 Essential (primary) hypertension: Secondary | ICD-10-CM

## 2011-06-04 DIAGNOSIS — G309 Alzheimer's disease, unspecified: Secondary | ICD-10-CM

## 2011-06-04 DIAGNOSIS — M1710 Unilateral primary osteoarthritis, unspecified knee: Secondary | ICD-10-CM

## 2011-06-04 DIAGNOSIS — F329 Major depressive disorder, single episode, unspecified: Secondary | ICD-10-CM

## 2011-08-06 DIAGNOSIS — F329 Major depressive disorder, single episode, unspecified: Secondary | ICD-10-CM

## 2011-08-06 DIAGNOSIS — M1710 Unilateral primary osteoarthritis, unspecified knee: Secondary | ICD-10-CM

## 2011-08-06 DIAGNOSIS — I10 Essential (primary) hypertension: Secondary | ICD-10-CM

## 2011-08-06 DIAGNOSIS — F0280 Dementia in other diseases classified elsewhere without behavioral disturbance: Secondary | ICD-10-CM

## 2011-08-06 DIAGNOSIS — G309 Alzheimer's disease, unspecified: Secondary | ICD-10-CM

## 2011-12-03 DIAGNOSIS — M1710 Unilateral primary osteoarthritis, unspecified knee: Secondary | ICD-10-CM

## 2011-12-03 DIAGNOSIS — G309 Alzheimer's disease, unspecified: Secondary | ICD-10-CM

## 2011-12-03 DIAGNOSIS — F3289 Other specified depressive episodes: Secondary | ICD-10-CM

## 2011-12-03 DIAGNOSIS — I10 Essential (primary) hypertension: Secondary | ICD-10-CM

## 2011-12-03 DIAGNOSIS — F329 Major depressive disorder, single episode, unspecified: Secondary | ICD-10-CM

## 2011-12-03 DIAGNOSIS — IMO0002 Reserved for concepts with insufficient information to code with codable children: Secondary | ICD-10-CM

## 2011-12-03 DIAGNOSIS — F0280 Dementia in other diseases classified elsewhere without behavioral disturbance: Secondary | ICD-10-CM

## 2012-01-19 DIAGNOSIS — J209 Acute bronchitis, unspecified: Secondary | ICD-10-CM

## 2012-02-11 IMAGING — CR DG KNEE 1-2V*L*
1 series · 2 of 2 positions shown · non-contrast
Comparison: none

REASON FOR EXAM: fall pain
COMMENTS:

[Series 1: view not recorded · 0.17mm/px · 2 of 2 slices shown]
[im 1/2]
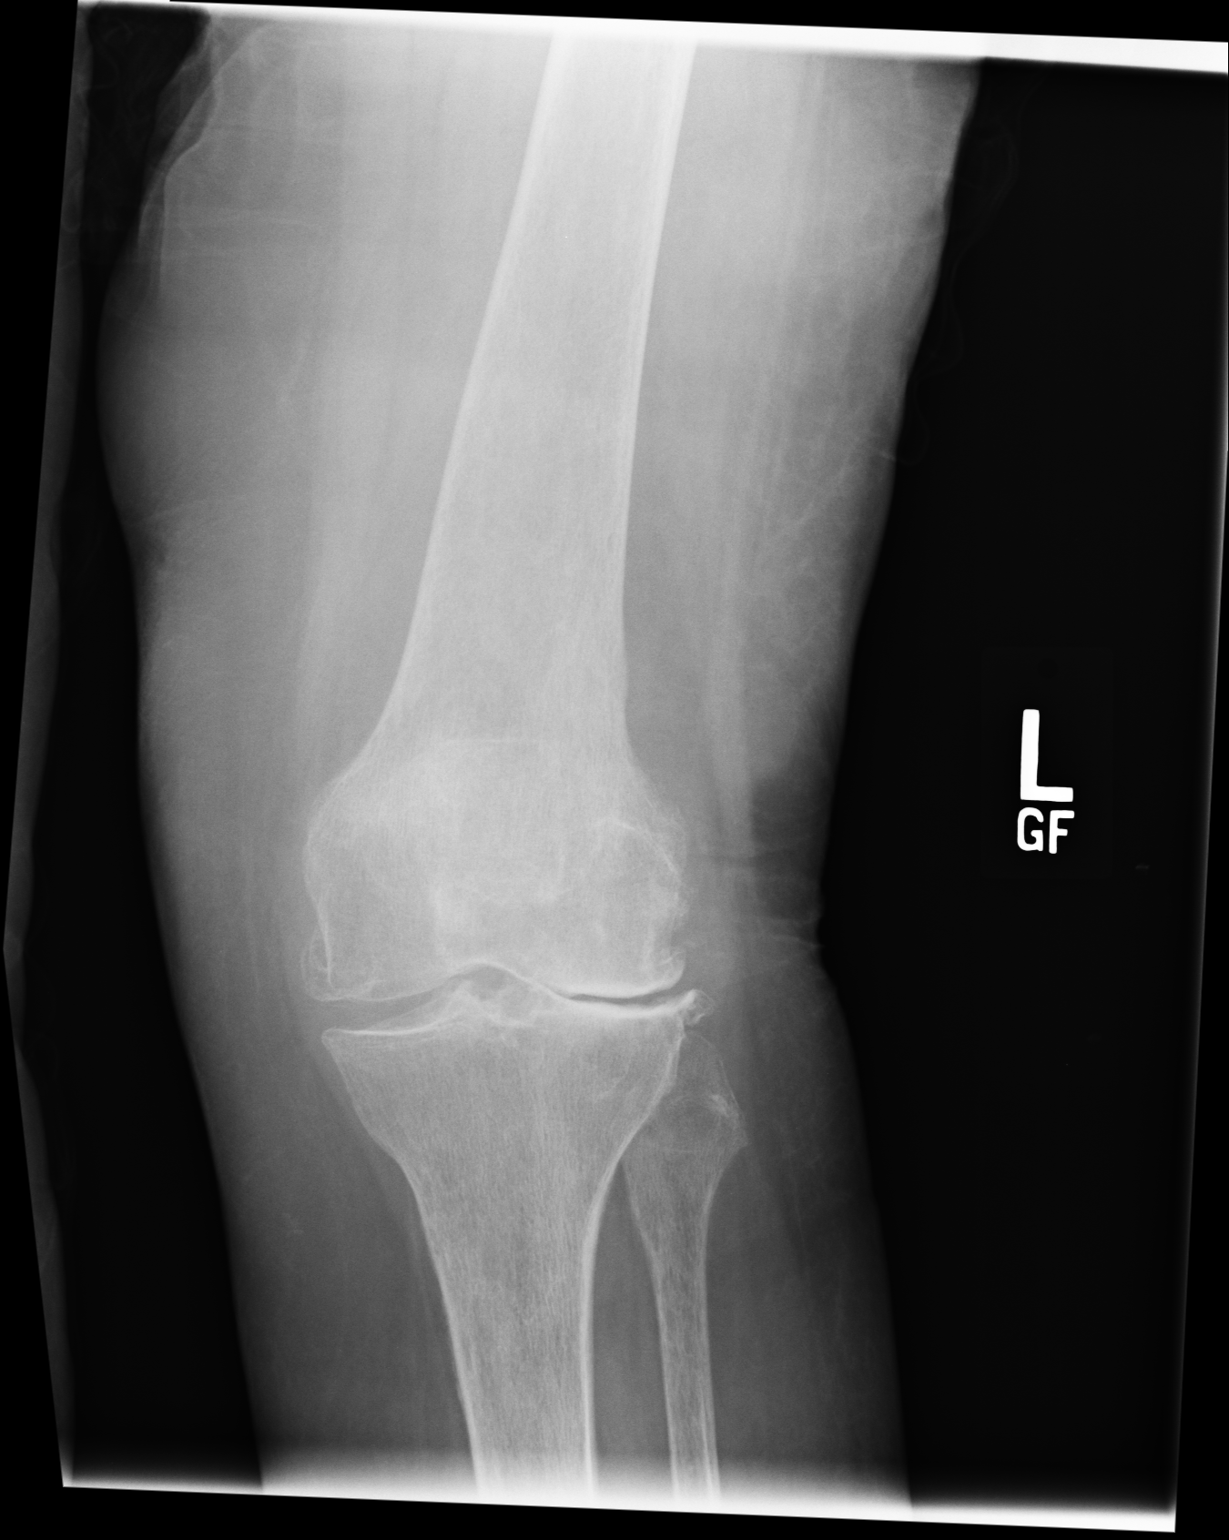
[im 2/2]
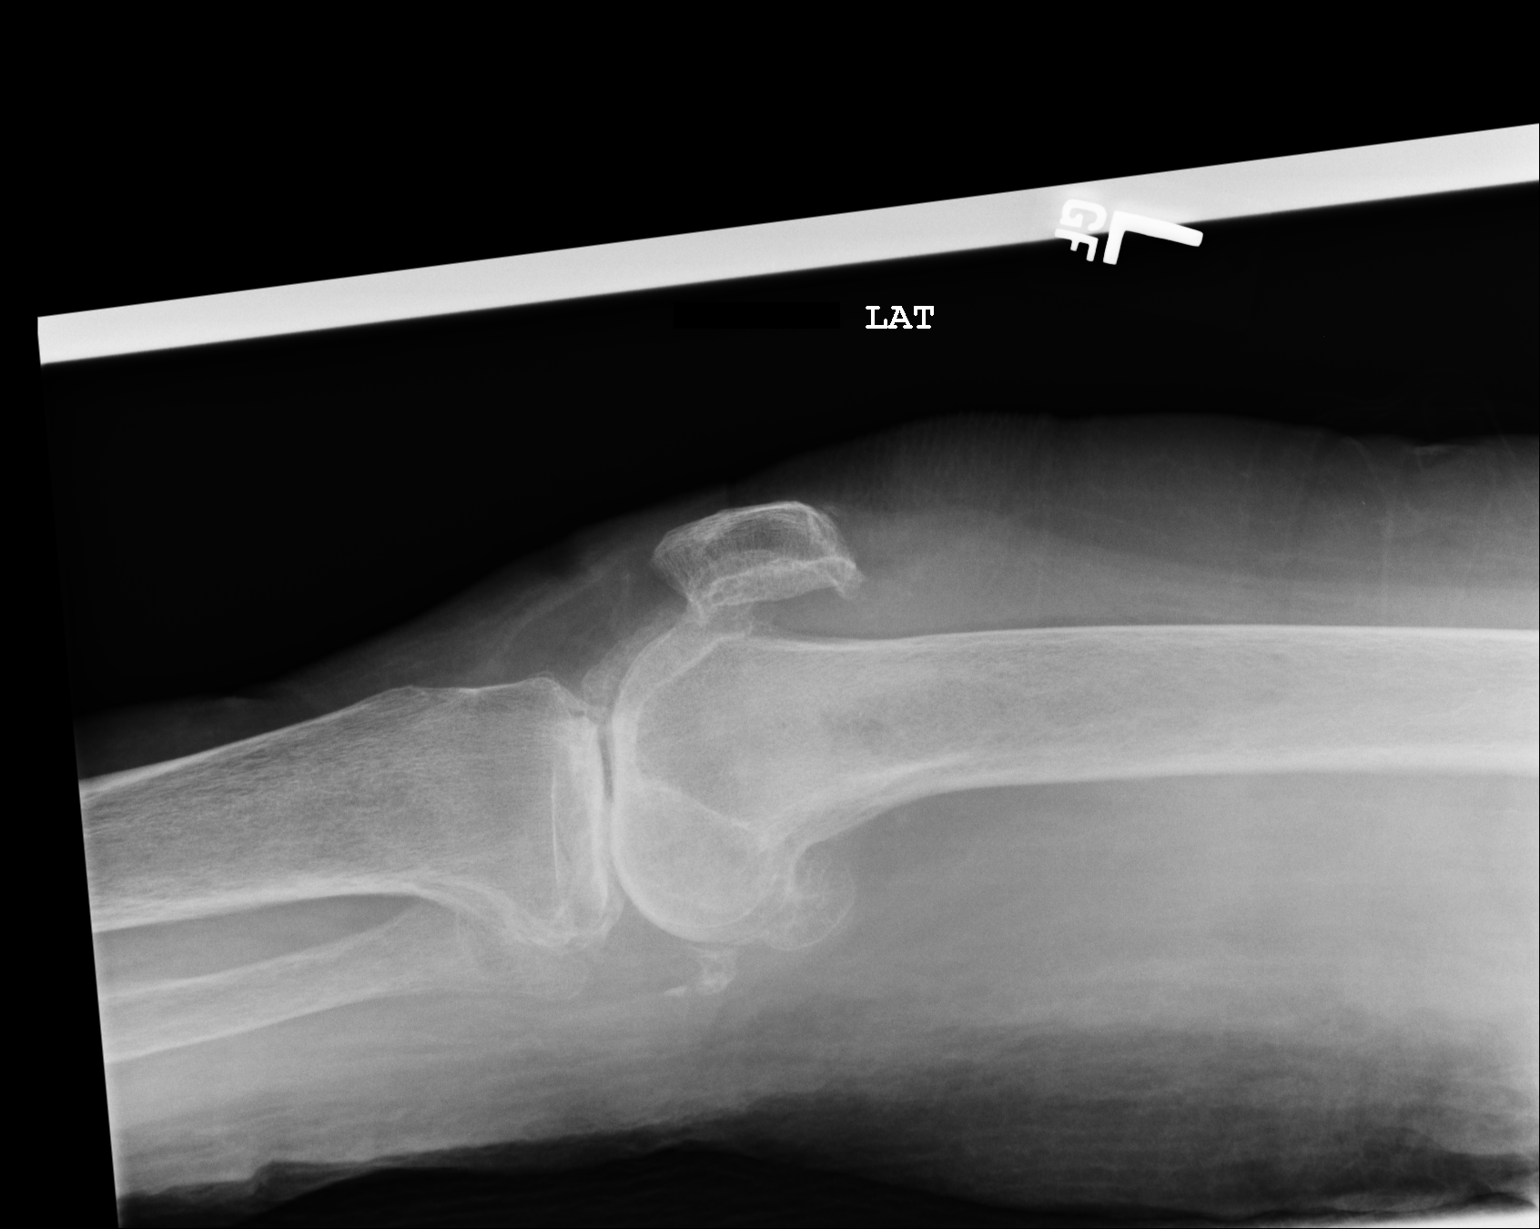

[2 of 2 positions shown; findings below may reference images not displayed]

PROCEDURE:     DXR - DXR KNEE LEFT AP AND LATERAL  - April 28, 2010  [DATE]

RESULT:     Images of the left knee show severe degenerative change with
possible joint effusion. There is severe joint space narrowing with
subchondral sclerosis and hypertrophic degenerative marginal spurring. No
fracture is appreciated.
IMPRESSION: Severe degenerative changes in the left knee as described.
A joint effusion may be present.

## 2012-02-12 DIAGNOSIS — H1030 Unspecified acute conjunctivitis, unspecified eye: Secondary | ICD-10-CM

## 2012-03-31 DIAGNOSIS — I10 Essential (primary) hypertension: Secondary | ICD-10-CM

## 2012-03-31 DIAGNOSIS — G309 Alzheimer's disease, unspecified: Secondary | ICD-10-CM

## 2012-03-31 DIAGNOSIS — F0280 Dementia in other diseases classified elsewhere without behavioral disturbance: Secondary | ICD-10-CM

## 2012-03-31 DIAGNOSIS — M159 Polyosteoarthritis, unspecified: Secondary | ICD-10-CM

## 2012-03-31 DIAGNOSIS — F329 Major depressive disorder, single episode, unspecified: Secondary | ICD-10-CM

## 2012-05-31 DIAGNOSIS — F0280 Dementia in other diseases classified elsewhere without behavioral disturbance: Secondary | ICD-10-CM

## 2012-05-31 DIAGNOSIS — M159 Polyosteoarthritis, unspecified: Secondary | ICD-10-CM

## 2012-05-31 DIAGNOSIS — F329 Major depressive disorder, single episode, unspecified: Secondary | ICD-10-CM

## 2012-05-31 DIAGNOSIS — I10 Essential (primary) hypertension: Secondary | ICD-10-CM

## 2012-05-31 DIAGNOSIS — G309 Alzheimer's disease, unspecified: Secondary | ICD-10-CM

## 2012-08-04 DIAGNOSIS — M159 Polyosteoarthritis, unspecified: Secondary | ICD-10-CM

## 2012-08-04 DIAGNOSIS — F329 Major depressive disorder, single episode, unspecified: Secondary | ICD-10-CM

## 2012-08-04 DIAGNOSIS — G309 Alzheimer's disease, unspecified: Secondary | ICD-10-CM

## 2012-08-04 DIAGNOSIS — F0280 Dementia in other diseases classified elsewhere without behavioral disturbance: Secondary | ICD-10-CM

## 2012-08-04 DIAGNOSIS — I10 Essential (primary) hypertension: Secondary | ICD-10-CM

## 2012-09-22 DIAGNOSIS — F329 Major depressive disorder, single episode, unspecified: Secondary | ICD-10-CM

## 2012-09-22 DIAGNOSIS — G309 Alzheimer's disease, unspecified: Secondary | ICD-10-CM

## 2012-09-22 DIAGNOSIS — M159 Polyosteoarthritis, unspecified: Secondary | ICD-10-CM

## 2012-09-22 DIAGNOSIS — F0280 Dementia in other diseases classified elsewhere without behavioral disturbance: Secondary | ICD-10-CM

## 2012-09-22 DIAGNOSIS — I10 Essential (primary) hypertension: Secondary | ICD-10-CM

## 2012-09-29 DIAGNOSIS — H01009 Unspecified blepharitis unspecified eye, unspecified eyelid: Secondary | ICD-10-CM

## 2012-12-08 DIAGNOSIS — F329 Major depressive disorder, single episode, unspecified: Secondary | ICD-10-CM

## 2012-12-08 DIAGNOSIS — M159 Polyosteoarthritis, unspecified: Secondary | ICD-10-CM

## 2012-12-08 DIAGNOSIS — I10 Essential (primary) hypertension: Secondary | ICD-10-CM

## 2012-12-08 DIAGNOSIS — F0280 Dementia in other diseases classified elsewhere without behavioral disturbance: Secondary | ICD-10-CM

## 2012-12-08 DIAGNOSIS — G309 Alzheimer's disease, unspecified: Secondary | ICD-10-CM

## 2012-12-08 DIAGNOSIS — F3289 Other specified depressive episodes: Secondary | ICD-10-CM

## 2013-01-19 DIAGNOSIS — F339 Major depressive disorder, recurrent, unspecified: Secondary | ICD-10-CM

## 2013-01-19 DIAGNOSIS — I10 Essential (primary) hypertension: Secondary | ICD-10-CM

## 2013-03-23 DIAGNOSIS — G309 Alzheimer's disease, unspecified: Secondary | ICD-10-CM

## 2013-03-23 DIAGNOSIS — F329 Major depressive disorder, single episode, unspecified: Secondary | ICD-10-CM

## 2013-03-23 DIAGNOSIS — F0280 Dementia in other diseases classified elsewhere without behavioral disturbance: Secondary | ICD-10-CM

## 2013-03-23 DIAGNOSIS — M159 Polyosteoarthritis, unspecified: Secondary | ICD-10-CM

## 2013-03-23 DIAGNOSIS — I10 Essential (primary) hypertension: Secondary | ICD-10-CM

## 2013-05-13 ENCOUNTER — Inpatient Hospital Stay: Payer: Self-pay | Admitting: Internal Medicine

## 2013-05-13 LAB — URINALYSIS, COMPLETE
Glucose,UR: NEGATIVE mg/dL (ref 0–75)
RBC,UR: 96 /HPF (ref 0–5)
Specific Gravity: 1.011 (ref 1.003–1.030)
Squamous Epithelial: NONE SEEN

## 2013-05-13 LAB — COMPREHENSIVE METABOLIC PANEL
BUN: 57 mg/dL — ABNORMAL HIGH (ref 7–18)
Bilirubin,Total: 0.2 mg/dL (ref 0.2–1.0)
Calcium, Total: 9.4 mg/dL (ref 8.5–10.1)
Chloride: 111 mmol/L — ABNORMAL HIGH (ref 98–107)
Co2: 25 mmol/L (ref 21–32)
Creatinine: 3.76 mg/dL — ABNORMAL HIGH (ref 0.60–1.30)
Glucose: 87 mg/dL (ref 65–99)
Potassium: 5.1 mmol/L (ref 3.5–5.1)
SGOT(AST): 25 U/L (ref 15–37)
SGPT (ALT): 22 U/L (ref 12–78)
Total Protein: 6.4 g/dL (ref 6.4–8.2)

## 2013-05-13 LAB — CBC
HCT: 37.7 % (ref 35.0–47.0)
HGB: 12.2 g/dL (ref 12.0–16.0)
MCH: 27.8 pg (ref 26.0–34.0)
MCV: 86 fL (ref 80–100)
Platelet: 96 10*3/uL — ABNORMAL LOW (ref 150–440)
RBC: 4.38 10*6/uL (ref 3.80–5.20)
WBC: 4.8 10*3/uL (ref 3.6–11.0)

## 2013-05-13 LAB — PROTIME-INR
INR: 1.1
Prothrombin Time: 14.4 secs (ref 11.5–14.7)

## 2013-05-13 LAB — PHOSPHORUS: Phosphorus: 4.4 mg/dL (ref 2.5–4.9)

## 2013-05-14 LAB — BASIC METABOLIC PANEL
Anion Gap: 7 (ref 7–16)
BUN: 53 mg/dL — ABNORMAL HIGH (ref 7–18)
Calcium, Total: 8.9 mg/dL (ref 8.5–10.1)
Co2: 20 mmol/L — ABNORMAL LOW (ref 21–32)
Creatinine: 3.56 mg/dL — ABNORMAL HIGH (ref 0.60–1.30)
EGFR (Non-African Amer.): 10 — ABNORMAL LOW
Glucose: 132 mg/dL — ABNORMAL HIGH (ref 65–99)
Potassium: 4.9 mmol/L (ref 3.5–5.1)
Sodium: 141 mmol/L (ref 136–145)

## 2013-05-14 LAB — CBC WITH DIFFERENTIAL/PLATELET
Basophil #: 0 10*3/uL (ref 0.0–0.1)
Basophil %: 0.3 %
Eosinophil #: 0.1 10*3/uL (ref 0.0–0.7)
Eosinophil %: 0.8 %
HGB: 10.8 g/dL — ABNORMAL LOW (ref 12.0–16.0)
Lymphocyte %: 29.1 %
MCH: 28.5 pg (ref 26.0–34.0)
MCV: 86 fL (ref 80–100)
Monocyte %: 5.7 %
Neutrophil #: 4.2 10*3/uL (ref 1.4–6.5)
Neutrophil %: 64.1 %
Platelet: 105 10*3/uL — ABNORMAL LOW (ref 150–440)
RBC: 3.78 10*6/uL — ABNORMAL LOW (ref 3.80–5.20)
RDW: 17.1 % — ABNORMAL HIGH (ref 11.5–14.5)
WBC: 6.6 10*3/uL (ref 3.6–11.0)

## 2013-05-15 ENCOUNTER — Telehealth: Payer: Self-pay | Admitting: Family Medicine

## 2013-05-15 LAB — BASIC METABOLIC PANEL
Anion Gap: 5 — ABNORMAL LOW (ref 7–16)
BUN: 47 mg/dL — ABNORMAL HIGH (ref 7–18)
Calcium, Total: 8.5 mg/dL (ref 8.5–10.1)
Creatinine: 3.59 mg/dL — ABNORMAL HIGH (ref 0.60–1.30)
EGFR (African American): 12 — ABNORMAL LOW
Glucose: 103 mg/dL — ABNORMAL HIGH (ref 65–99)
Sodium: 139 mmol/L (ref 136–145)

## 2013-05-15 NOTE — Telephone Encounter (Signed)
Was admitted to [facility] Will follow progress there 

## 2013-05-15 NOTE — Telephone Encounter (Signed)
[facility] Triage Call Report Triage Record Num: [id number] Operator: Tomasita Crumble Patient Name: Lindsay Hayes Call Date & Time: 05/13/2013 3:51:39PM Patient Phone: [phone] PCP: Patient Gender: Female PCP Fax : Patient DOB: [birth year] Practice Name: [facility] Reason for Call: Caller: Betsy/RN from [facility]; PCP: Tillman Abide [facility]); CB#: [phone]; Call regarding Change of status; Patient is full code. She is not able to get BP. Pulse 52, Resp 14. Tried to get to temperature; it does not register orally or axillary She is responsive and coherent, weak; described as "It's like she is fading away." Other emergent symptoms ruled out. Activate EMS 911 per Weakness or Paralysis guideline due to New or worsneing signs and symptoms that may indicate shock. Dr. Clent Ridges on call. Protocol(s) Used: Weakness or Paralysis Recommended Outcome per Protocol: Activate EMS 911 Reason for Outcome: New or worsening signs and symptoms that may indicate shock Care Advice: ~ Protect the patient from falling or other harm. ~ Do not give the patient anything to eat or drink. Write down provider's name. List or place the following in a bag for transport with the patient: current prescription and/or nonprescription medications; alternative treatments, therapies and medications; and street drugs. ~ An adult should stay with the patient, preferably one trained in CPR. If the person is not trained in CPR, then he or she should provide hands-only (compression-only) CPR as recommended by the American Heart Association. ~ 05/13/2013 4:02:43PM Page 1 of 1 [email or web address]

## 2013-05-16 DIAGNOSIS — G309 Alzheimer's disease, unspecified: Secondary | ICD-10-CM

## 2013-05-16 DIAGNOSIS — F0280 Dementia in other diseases classified elsewhere without behavioral disturbance: Secondary | ICD-10-CM

## 2013-05-16 DIAGNOSIS — J189 Pneumonia, unspecified organism: Secondary | ICD-10-CM

## 2013-05-16 DIAGNOSIS — E639 Nutritional deficiency, unspecified: Secondary | ICD-10-CM

## 2013-05-18 LAB — CULTURE, BLOOD (SINGLE)

## 2013-05-24 ENCOUNTER — Telehealth: Payer: Self-pay | Admitting: *Deleted

## 2013-05-24 DIAGNOSIS — G309 Alzheimer's disease, unspecified: Secondary | ICD-10-CM

## 2013-05-24 DIAGNOSIS — G9341 Metabolic encephalopathy: Secondary | ICD-10-CM

## 2013-05-24 DIAGNOSIS — F0280 Dementia in other diseases classified elsewhere without behavioral disturbance: Secondary | ICD-10-CM

## 2013-05-24 DIAGNOSIS — N289 Disorder of kidney and ureter, unspecified: Secondary | ICD-10-CM

## 2013-06-18 NOTE — Telephone Encounter (Signed)
Noted.  This was expected...

## 2013-06-18 NOTE — Telephone Encounter (Signed)
[name] from Hospice called to let you know that Ms. Lindsay GandyWilder passed away today. They will be doing the usual orders to release the body and just wanted you to be aware. She said to call her if you had any further questions. She missed your return call earlier. [phone].

## 2013-06-18 DEATH — deceased

## 2014-09-07 NOTE — H&P (Signed)
PATIENT NAME:  Lindsay Hayes, ONOFRIO MR#:  [id number] DATE OF BIRTH:  [birth year]  DATE OF ADMISSION:  05/13/2013  REFERRING PHYSICIAN:  Dr. Daryel November.   FAMILY PHYSICIAN:  Dr. Alphonsus Sias.   REASON FOR ADMISSION:  Altered mental status.   HISTORY OF PRESENT ILLNESS:  The patient is a 81 year old female who resides at [facility] with a history of dementia, hypertension and recurrent UTIs, who presents to the Emergency Room with a 2 to 3 day history of weakness, lethargy and worsening mental status changes.  In the Emergency Room, the patient was found to be profoundly hypothermic and bradycardic with a UTI.  She is now admitted for further evaluation.  The patient is unable to give history.   PAST MEDICAL HISTORY: 1.  Senile dementia.  2.  Recurrent UTIs.  3.  Recent pneumonia.  4.  History of depression.  5.  Osteoarthritis.  6.  Benign hypertension.  7.  Status post hip surgery.   MEDICATIONS: 1.  Vitamin D3 1000 units by mouth daily.  2.  Tramadol 50 mg by mouth three times daily as needed pain.  3.  Toprol-XL 25 mg by mouth daily.  4.  Diovan 80 mg by mouth daily.  5.  Multivitamin 1 by mouth daily.  6.  Lidoderm patch applied topically to left knee for 12 hours a day.   ALLERGIES:  ASPIRIN AND HYDROCHLOROTHIAZIDE.   SOCIAL HISTORY:  Negative for alcohol or tobacco abuse.   FAMILY HISTORY:  Unknown.   REVIEW OF SYSTEMS:  Unable to obtain from the patient.   PHYSICAL EXAMINATION: GENERAL:  The patient is elderly, lethargic, in no acute distress.  VITAL SIGNS:  Currently remarkable for a blood pressure of 147/78 with a heart rate of 55, temperature of 90.4. HEENT:  Pupils equally round and reactive to light and accommodation, extraocular movements are intact.  Sclerae anicteric.  Conjunctivae are clear.  Oropharynx is dry, but clear.  NECK:  Supple without JVD.  No lymphadenopathy or thyromegaly is noted.  LUNGS:  Basilar rhonchi without wheezes or rales.  No dullness.   Respiratory effort is normal.  CARDIAC:  Slow rate with a regular rhythm.  Normal S1 and S2.  No significant rubs or gallops present.  ABDOMEN:  Soft, nontender, with normoactive bowel sounds.  No organomegaly or masses were appreciated.  No hernias or bruits were noted.  EXTREMITIES:  Revealed 1+ pedal edema.  Pulses were 1+ bilaterally.  SKIN:  Cool, dry without rashes or lesions.  NEUROLOGIC:  Grossly nonfocal, except for some significant diffuse weakness which was symmetric and bilateral.  PSYCHIATRIC:  Unable to be performed.   LABORATORY DATA:  Troponin was 0.03.  Magnesium 1.9.  Glucose 87 with a BUN of 57 and a creatinine of 3.76 with a GFR of 10.  Sodium was 140 with a potassium of 5.1.  White count was 4.8 with a hemoglobin of 12.2 and a platelet count of 96,000.  Urinalysis revealed 2+ leukocyte esterase with 3+ bacteria and 10,223 WBCs per high powered field.  Head CT revealed no acute intracranial findings.  Chest x-ray revealed bibasilar effusions and atelectasis with questionable consolidation of the left lower lobe.   ASSESSMENT: 1.  Hypothermia.  2.  Urinary tract infection with presumed sepsis.  3.  Acute renal failure.  4.  Dehydration.  5.  Metabolic encephalopathy.  6.  Senile dementia.   PLAN:  The patient will be admitted to telemetry as a NO CODE BLUE, DO NOT RESUSCITATE after  discussion with her family members.  They want [facility] to [facility] a [facility] as well.  She will be admitted and started on IV fluids and IV antibiotics.  She will be kept nothing by mouth except for ice chips and meds at this time until her mental status improves.  We will use a warming blanket to get her temperature up to 96 degrees Fahrenheit.  We will send off blood and urine cultures.  Follow up routine labs in the morning.  We will consult the social worker and physical therapy.  We will obtain a renal ultrasound.  Further treatment and evaluation will depend upon the patient's  progress.   Total time spent on this patient was 50 minutes.    ____________________________ Duane LopeJeffrey D. Judithann SheenSparks, MD jds:ea D: 05/13/2013 21:20:33 ET T: 05/13/2013 23:13:23 ET JOB#: [id number]  cc: Duane LopeJeffrey D. Judithann SheenSparks, MD, <Dictator> Karie Schwalbeichard I. Letvak, MD Nastashia Gallo Rodena Medin Tachina Spoonemore MD ELECTRONICALLY SIGNED 05/14/2013 0:43

## 2014-09-07 NOTE — Discharge Summary (Signed)
PATIENT NAME:  Lindsay Hayes, Lindsay Hayes MR#:  [id number] DATE OF BIRTH:  03-01-1914  DATE OF ADMISSION:  05/13/2013 DATE OF DISCHARGE:  05/15/2013  ADMISSION DIAGNOSES: 1.  Metabolic encephalopathy. 2.  Urinary tract infection.  DISCHARGE DIAGNOSES:  1.  Metabolic encephalopathy secondary to urinary tract infection. 2.  Urinary tract infection. 3.  History of dementia. 4.  History of hypertension. 5.  Renal insufficiency, likely stage III, chronic renal failure.  CONSULTANTS: None.  LABORATORIES AT DISCHARGE: Blood cultures are negative. Urine culture is actually negative. Discharge sodium 139, potassium 4.7, chloride 112, bicarb 22, BUN 47, creatinine 3.59, glucose 103, calcium 8.5.  White blood cells 6.6, hemoglobin 11, hematocrit 32.4, platelets 105.  Renal ultrasound showed no evidence of hydronephrosis.  Chest x-ray shows no acute cardiopulmonary disease.  HOSPITAL COURSE: This is a 81 year old female who lives in [place] who presented with metabolic encephalopathy, found to have a urinary tract infection. For further details, please refer to the H and P. 1.  Metabolic encephalopathy. The patient's UA was significant for a urinary tract infection. Urine culture and blood cultures are negative to date. She was started on Rocephin. She is transitioned to p.o. antibiotics at discharge. Although her urine culture was negative, we will treat for UTI. 2.  UTI. Urine culture shows no growth. We changed to Augmentin as the UA initially looked like she had a urinary tract infection. 3.  Renal insufficiency, probably chronic in nature. Creatinine has really not changed much since admission, unclear what her baseline is. She had a kidney ultrasound which showed no evidence of hydronephrosis. She will avoid nephrotoxic agents. 4.  Dementia. 5.  Sepsis from urinary tract infection, which is resolving. 6.  Hypertension. The patient will continue on metoprolol.  DISCHARGE MEDICATIONS: 1.  Metoprolol  25 mg daily. 2.  Multivitamin 1 tablet a day. 3.  PreserVision 1 tablet b.i.d.  4.  Docusate 100 mg b.i.d.  5.  Vitamin D 50,000 international units monthly, on the 15th. 6.  Timolol eyedrops. 7.  Citalopram 20 mg daily. 8.  Systane 1 drop to left eye 2 times a day as needed for irritation and redness. 9.  Tylenol 325 mg 2 tablets q. 6 hours p.r.n. pain. 10.  Ativan 0.5 mg t.i.d. p.r.n. anxiety. 11.  Ativan 0.5 mg gel t.i.d. to wrist as needed for anxiety and agitation. 12.  Ativan 0.5 mg in the morning. 13.  Keflex 500 mg t.i.d. for 5 days.  DISCHARGE DIET: Pureed.  DISCHARGE ACTIVITY: As tolerated.  DISCHARGE FOLLOWUP: The patient will follow up with her MD at the facility.  The patient is medically stable for discharge.   TIME SPENT ON DISCHARGE: Approximately 35 minutes.  ____________________________ Janyth ContesSital P. Juliene PinaMody, MD spm:sb D: 05/15/2013 10:10:20 ET T: 05/15/2013 10:25:43 ET JOB#: [id number]  cc: Ron Junco P. Juliene PinaMody, MD, <Dictator> Janyth ContesSITAL P Adana Marik MD ELECTRONICALLY SIGNED 05/15/2013 13:04
